# Patient Record
Sex: Male | Born: 1990 | Race: Black or African American | Hispanic: No | Marital: Single | Smoking: Current every day smoker
Health system: Southern US, Community
[De-identification: ages and names within clinical notes are randomized; demographics above are authoritative.]

## PROBLEM LIST (undated history)

## (undated) HISTORY — PX: TESTICLE REMOVAL: SHX68

## (undated) HISTORY — PX: LEG SURGERY: SHX1003

---

## 2010-06-17 ENCOUNTER — Emergency Department: Payer: Self-pay | Admitting: Emergency Medicine

## 2010-07-22 ENCOUNTER — Emergency Department: Payer: Self-pay | Admitting: Emergency Medicine

## 2010-07-25 ENCOUNTER — Emergency Department (HOSPITAL_COMMUNITY)
Admission: EM | Admit: 2010-07-25 | Discharge: 2010-07-25 | Disposition: A | Discharge status: Home / Self Care | Payer: Self-pay | Attending: Emergency Medicine | Admitting: Emergency Medicine

## 2010-07-25 ENCOUNTER — Emergency Department (HOSPITAL_COMMUNITY): Payer: Self-pay

## 2010-07-25 DIAGNOSIS — R599 Enlarged lymph nodes, unspecified: Secondary | ICD-10-CM | POA: Insufficient documentation

## 2010-07-25 DIAGNOSIS — R1310 Dysphagia, unspecified: Secondary | ICD-10-CM | POA: Insufficient documentation

## 2010-07-25 DIAGNOSIS — J039 Acute tonsillitis, unspecified: Secondary | ICD-10-CM | POA: Insufficient documentation

## 2010-07-25 MED ORDER — IOHEXOL 300 MG/ML  SOLN: 75.0000 mL | Freq: Once | INTRAMUSCULAR | Status: DC | PRN

## 2011-06-10 ENCOUNTER — Emergency Department: Payer: Self-pay | Admitting: Emergency Medicine

## 2011-06-13 ENCOUNTER — Emergency Department: Payer: Self-pay | Admitting: Emergency Medicine

## 2011-06-15 ENCOUNTER — Emergency Department: Payer: Self-pay | Admitting: *Deleted

## 2012-06-06 ENCOUNTER — Emergency Department: Payer: Self-pay

## 2015-06-06 ENCOUNTER — Encounter: Payer: Self-pay | Admitting: Emergency Medicine

## 2015-06-06 ENCOUNTER — Emergency Department
Admission: EM | Admit: 2015-06-06 | Discharge: 2015-06-06 | Disposition: A | Discharge status: Home / Self Care | Payer: Self-pay | Attending: Emergency Medicine | Admitting: Emergency Medicine

## 2015-06-06 DIAGNOSIS — R300 Dysuria: Secondary | ICD-10-CM

## 2015-06-06 DIAGNOSIS — F17200 Nicotine dependence, unspecified, uncomplicated: Secondary | ICD-10-CM | POA: Insufficient documentation

## 2015-06-06 DIAGNOSIS — Z202 Contact with and (suspected) exposure to infections with a predominantly sexual mode of transmission: Secondary | ICD-10-CM

## 2015-06-06 DIAGNOSIS — R360 Urethral discharge without blood: Secondary | ICD-10-CM | POA: Insufficient documentation

## 2015-06-06 LAB — CHLAMYDIA/NGC RT PCR (ARMC ONLY)
Chlamydia Tr: NOT DETECTED
N gonorrhoeae: DETECTED — AB

## 2015-06-06 MED ORDER — AZITHROMYCIN 250 MG PO TABS
1000.0000 mg | ORAL_TABLET | Freq: Once | ORAL | Status: AC
  Administered 2015-06-06: 1000 mg via ORAL
  Filled 2015-06-06: qty 4

## 2015-06-06 MED ORDER — LIDOCAINE HCL (PF) 1 % IJ SOLN
INTRAMUSCULAR | Status: AC
  Administered 2015-06-06: 0.9 mL
  Filled 2015-06-06: qty 5

## 2015-06-06 MED ORDER — METRONIDAZOLE 500 MG PO TABS: 2000.0000 mg | ORAL_TABLET | Freq: Once | ORAL | Status: DC

## 2015-06-06 MED ORDER — CEFTRIAXONE SODIUM 250 MG IJ SOLR
250.0000 mg | Freq: Once | INTRAMUSCULAR | Status: AC
  Administered 2015-06-06: 250 mg via INTRAMUSCULAR
  Filled 2015-06-06: qty 250

## 2015-06-06 NOTE — ED Notes (Signed)
Pt c/o dysuria and white penile discharge for 2 days.

## 2015-06-06 NOTE — Discharge Instructions (Signed)
Sexually Transmitted Disease °A sexually transmitted disease (STD) is a disease or infection that may be passed (transmitted) from person to person, usually during sexual activity. This may happen by way of saliva, semen, blood, vaginal mucus, or urine. Common STDs include: °· Gonorrhea. °· Chlamydia. °· Syphilis. °· HIV and AIDS. °· Genital herpes. °· Hepatitis B and C. °· Trichomonas. °· Human papillomavirus (HPV). °· Pubic lice. °· Scabies. °· Mites. °· Bacterial vaginosis. °WHAT ARE CAUSES OF STDs? °An STD may be caused by bacteria, a virus, or parasites. STDs are often transmitted during sexual activity if one person is infected. However, they may also be transmitted through nonsexual means. STDs may be transmitted after:  °· Sexual intercourse with an infected person. °· Sharing sex toys with an infected person. °· Sharing needles with an infected person or using unclean piercing or tattoo needles. °· Having intimate contact with the genitals, mouth, or rectal areas of an infected person. °· Exposure to infected fluids during birth. °WHAT ARE THE SIGNS AND SYMPTOMS OF STDs? °Different STDs have different symptoms. Some people may not have any symptoms. If symptoms are present, they may include: °· Painful or bloody urination. °· Pain in the pelvis, abdomen, vagina, anus, throat, or eyes. °· A skin rash, itching, or irritation. °· Growths, ulcerations, blisters, or sores in the genital and anal areas. °· Abnormal vaginal discharge with or without bad odor. °· Penile discharge in men. °· Fever. °· Pain or bleeding during sexual intercourse. °· Swollen glands in the groin area. °· Yellow skin and eyes (jaundice). This is seen with hepatitis. °· Swollen testicles. °· Infertility. °· Sores and blisters in the mouth. °HOW ARE STDs DIAGNOSED? °To make a diagnosis, your health care provider may: °· Take a medical history. °· Perform a physical exam. °· Take a sample of any discharge to examine. °· Swab the throat,  cervix, opening to the penis, rectum, or vagina for testing. °· Test a sample of your first morning urine. °· Perform blood tests. °· Perform a Pap test, if this applies. °· Perform a colposcopy. °· Perform a laparoscopy. °HOW ARE STDs TREATED? °Treatment depends on the STD. Some STDs may be treated but not cured. °· Chlamydia, gonorrhea, trichomonas, and syphilis can be cured with antibiotic medicine. °· Genital herpes, hepatitis, and HIV can be treated, but not cured, with prescribed medicines. The medicines lessen symptoms. °· Genital warts from HPV can be treated with medicine or by freezing, burning (electrocautery), or surgery. Warts may come back. °· HPV cannot be cured with medicine or surgery. However, abnormal areas may be removed from the cervix, vagina, or vulva. °· If your diagnosis is confirmed, your recent sexual partners need treatment. This is true even if they are symptom-free or have a negative culture or evaluation. They should not have sex until their health care providers say it is okay. °· Your health care provider may test you for infection again 3 months after treatment. °HOW CAN I REDUCE MY RISK OF GETTING AN STD? °Take these steps to reduce your risk of getting an STD: °· Use latex condoms, dental dams, and water-soluble lubricants during sexual activity. Do not use petroleum jelly or oils. °· Avoid having multiple sex partners. °· Do not have sex with someone who has other sex partners °· Do not have sex with anyone you do not know or who is at high risk for an STD. °· Avoid risky sex practices that can break your skin. °· Do not have sex   if you have open sores on your mouth or skin.  Avoid drinking too much alcohol or taking illegal drugs. Alcohol and drugs can affect your judgment and put you in a vulnerable position.  Avoid engaging in oral and anal sex acts.  Get vaccinated for HPV and hepatitis. If you have not received these vaccines in the past, talk to your health care  provider about whether one or both might be right for you.  If you are at risk of being infected with HIV, it is recommended that you take a prescription medicine daily to prevent HIV infection. This is called pre-exposure prophylaxis (PrEP). You are considered at risk if:  You are a man who has sex with other men (MSM).  You are a heterosexual man or woman and are sexually active with more than one partner.  You take drugs by injection.  You are sexually active with a partner who has HIV.  Talk with your health care provider about whether you are at high risk of being infected with HIV. If you choose to begin PrEP, you should first be tested for HIV. You should then be tested every 3 months for as long as you are taking PrEP. WHAT SHOULD I DO IF I THINK I HAVE AN STD?  See your health care provider.  Tell your sexual partner(s). They should be tested and treated for any STDs.  Do not have sex until your health care provider says it is okay. WHEN SHOULD I GET IMMEDIATE MEDICAL CARE? Contact your health care provider right away if:   You have severe abdominal pain.  You are a man and notice swelling or pain in your testicles.  You are a woman and notice swelling or pain in your vagina.   This information is not intended to replace advice given to you by your health care provider. Make sure you discuss any questions you have with your health care provider.   Document Released: 08/06/2002 Document Revised: 06/06/2014 Document Reviewed: 12/04/2012 Elsevier Interactive Patient Education 2016 [employer]  You have been treated for gonorrhea and chlamydia here in the ED. You should take the prescription medicine as directed for treatment of trichomoniasis. You should avoid any sexual contact for at least 7 days, all medicines have been completed, and all symptoms have cleared. Any and all sexual partners must be treated before contact. [facility] for ongoing symptoms.

## 2015-06-06 NOTE — ED Provider Notes (Signed)
[facility] Emergency Department Provider Note ____________________________________________  Time seen: 1715  I have reviewed the triage vital signs and the nursing notes.  HISTORY  Chief Complaint  Dysuria  HPI Alejandro ManeDonald R Brooking Jr. is a 25 y.o. male reports to the ED for evaluation of a 2-day c/o penile discharge and dysuria. He denies fevers, chills, sweats, nausea, vomiting. He is also without any symptoms including hematuria, flank pain, or pelvic pain. He reports his discomfort as 6/10 in triage.  History reviewed. No pertinent past medical history.  There are no active problems to display for this patient.   Past Surgical History  Procedure Laterality Date  . Leg surgery      Current Outpatient Rx  Name  Route  Sig  Dispense  Refill  . metroNIDAZOLE (FLAGYL) 500 MG tablet   Oral   Take 4 tablets (2,000 mg total) by mouth once.   4 tablet   0    Allergies Review of patient's allergies indicates no known allergies.  History reviewed. No pertinent family history.  Social History Social History  Substance Use Topics  . Smoking status: Current Every Day Smoker  . Smokeless tobacco: None  . Alcohol Use: Yes   Review of Systems  Constitutional: Negative for fever. Eyes: Negative for visual changes. ENT: Negative for sore throat. Cardiovascular: Negative for chest pain. Respiratory: Negative for shortness of breath. Gastrointestinal: Negative for abdominal pain, vomiting and diarrhea. Genitourinary: Positive for dysuria and penile discharge as above Musculoskeletal: Negative for back pain. Skin: Negative for rash. Neurological: Negative for headaches, focal weakness or numbness. ____________________________________________  PHYSICAL EXAM:  VITAL SIGNS: ED Triage Vitals  Enc Vitals Group     BP 06/06/15 1632 138/84 mmHg     Pulse Rate 06/06/15 1632 98     Resp 06/06/15 1632 18     Temp 06/06/15 1632 97.8 F (36.6 C)     Temp  Source 06/06/15 1632 Oral     SpO2 06/06/15 1632 96 %     Weight 06/06/15 1632 240 lb (108.863 kg)     Height 06/06/15 1632 6\' 1"  (1.854 m)     Head Cir --      Peak Flow --      Pain Score 06/06/15 1630 6     Pain Loc --      Pain Edu? --      Excl. in GC? --    Constitutional: Alert and oriented. Well appearing and in no distress. Head: Normocephalic and atraumatic.      Eyes: Conjunctivae are normal. PERRL. Normal extraocular movements   Nose: No congestion/rhinorrhea.   Mouth/Throat: Mucous membranes are moist.   Neck: Supple. No thyromegaly. Hematological/Lymphatic/Immunological: No cervical lymphadenopathy. Cardiovascular: Normal rate, regular rhythm.  Respiratory: Normal respiratory effort. No wheezes/rales/rhonchi. Gastrointestinal: Soft and nontender. No distention. GU: uncircumcised male with thick, white discharge expressed from the glans penis. No inguinal nodes appreciated.  Musculoskeletal: Nontender with normal range of motion in all extremities.  Neurologic:  Normal gait without ataxia. Normal speech and language. No gross focal neurologic deficits are appreciated. Skin:  Skin is warm, dry and intact. No rash noted. Psychiatric: Mood and affect are normal. Patient exhibits appropriate insight and judgment. ____________________________________________   LABS (pertinent positives/negatives) Labs Reviewed  CHLAMYDIA/NGC RT PCR ([facility] ONLY)  ____________________________________________  PROCEDURES  Azithromycin 1000 mg PO Rocephin 250 mg IM ____________________________________________  INITIAL IMPRESSION / ASSESSMENT AND PLAN / ED COURSE  Patient with empiric treatment for gonorrhea and chlamydia  in the ED, and a prescription for metronidazole to dose for trichomoniasis provided. He is advised to avoid any sexual contact until all medications are completed, symptoms have resolved, and partners have also been treated. He will follow up with the  [facility] Department for ongoing symptoms.GC urine culture is pending at the time of discharge. ____________________________________________  FINAL CLINICAL IMPRESSION(S) / ED DIAGNOSES  Final diagnoses:  STD exposure  Dysuria      Lissa Hoard, PA-C 06/06/15 1818  Phineas Semen, MD 06/06/15 1924

## 2015-06-08 ENCOUNTER — Telehealth: Payer: Self-pay | Admitting: Emergency Medicine
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
## 2015-06-16 ENCOUNTER — Telehealth: Payer: Self-pay | Admitting: Emergency Medicine
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
## 2016-08-29 ENCOUNTER — Encounter: Payer: Self-pay | Admitting: Emergency Medicine

## 2016-08-29 ENCOUNTER — Emergency Department
Admission: EM | Admit: 2016-08-29 | Discharge: 2016-08-29 | Disposition: A | Discharge status: Home / Self Care | Payer: Self-pay | Attending: Student in an Organized Health Care Education/Training Program | Admitting: Student in an Organized Health Care Education/Training Program

## 2016-08-29 ENCOUNTER — Emergency Department: Payer: Self-pay

## 2016-08-29 DIAGNOSIS — F17200 Nicotine dependence, unspecified, uncomplicated: Secondary | ICD-10-CM | POA: Insufficient documentation

## 2016-08-29 DIAGNOSIS — L03115 Cellulitis of right lower limb: Secondary | ICD-10-CM | POA: Insufficient documentation

## 2016-08-29 LAB — COMPREHENSIVE METABOLIC PANEL
ALT: 34 U/L (ref 17–63)
ANION GAP: 5 (ref 5–15)
AST: 31 U/L (ref 15–41)
Albumin: 4.3 g/dL (ref 3.5–5.0)
Alkaline Phosphatase: 67 U/L (ref 38–126)
BUN: 13 mg/dL (ref 6–20)
CHLORIDE: 110 mmol/L (ref 101–111)
CO2: 28 mmol/L (ref 22–32)
Calcium: 9.3 mg/dL (ref 8.9–10.3)
Creatinine, Ser: 1.16 mg/dL (ref 0.61–1.24)
GFR calc non Af Amer: >60 mL/min (ref >60)
Glucose, Bld: 93 mg/dL (ref 65–99)
POTASSIUM: 4.1 mmol/L (ref 3.5–5.1)
SODIUM: 143 mmol/L (ref 135–145)
Total Bilirubin: 0.3 mg/dL (ref 0.3–1.2)
Total Protein: 7.9 g/dL (ref 6.5–8.1)

## 2016-08-29 LAB — CBC
HEMATOCRIT: 42.6 % (ref 40.0–52.0)
Hemoglobin: 14.3 g/dL (ref 13.0–18.0)
MCH: 28.2 pg (ref 26.0–34.0)
MCHC: 33.6 g/dL (ref 32.0–36.0)
MCV: 83.7 fL (ref 80.0–100.0)
Platelets: 241 10*3/uL (ref 150–440)
RBC: 5.09 MIL/uL (ref 4.40–5.90)
RDW: 13.9 % (ref 11.5–14.5)
WBC: 7.7 10*3/uL (ref 3.8–10.6)

## 2016-08-29 LAB — SEDIMENTATION RATE: SED RATE: 10 mm/h (ref 0–15)

## 2016-08-29 LAB — URIC ACID: Uric Acid, Serum: 7.2 mg/dL (ref 4.4–7.6)

## 2016-08-29 LAB — C-REACTIVE PROTEIN: CRP: 2.1 mg/dL — AB (ref <1.0)

## 2016-08-29 MED ORDER — CEPHALEXIN 500 MG PO CAPS: 500.0000 mg | ORAL_CAPSULE | Freq: Four times a day (QID) | ORAL | 0 refills | Status: AC

## 2016-08-29 NOTE — ED Triage Notes (Signed)
Pt reports right foot pain, unsure of injury.

## 2016-08-29 NOTE — ED Provider Notes (Signed)
[facility] Emergency Department Provider Note  ____________________________________________  Time seen: Approximately 10:48 AM  I have reviewed the triage vital signs and the nursing notes.   HISTORY  Chief Complaint Foot Pain    HPI Alejandro Mckee. is a 26 y.o. male presents to emergency departmentwith right foot pain for one week. Patient states that he's had for pain on and off for the last year but it has worsened over the last week. Patient says that top of foot is red and warm to touch. He is able to move his toes without difficulty. This has never happened before. No trauma. Patient states that he had surgery in that foot several years ago. No recent surgery. He has no history of gout. He denies fever, chills, shortness of breath, chest pain, nausea, vomiting, abdominal pain, calf pain.   History reviewed. No pertinent past medical history.  There are no active problems to display for this patient.   Past Surgical History:  Procedure Laterality Date  . LEG SURGERY      Prior to Admission medications   Medication Sig Start Date End Date Taking? Authorizing Provider  cephALEXin (KEFLEX) 500 MG capsule Take 1 capsule (500 mg total) by mouth 4 (four) times daily. 08/29/16 09/08/16  Enid Derry, PA-C    Allergies Patient has no known allergies.  No family history on file.  Social History Social History  Substance Use Topics  . Smoking status: Current Every Day Smoker  . Smokeless tobacco: Not on file  . Alcohol use Yes     Review of Systems  Constitutional: No fever/chills ENT: No upper respiratory complaints. Cardiovascular: No chest pain. Respiratory: No cough. No SOB. Gastrointestinal: No abdominal pain.  No nausea, no vomiting.  Musculoskeletal: Positive for right foot pain. Skin: Negative for rash, abrasions, lacerations, ecchymosis. Neurological: Negative for headaches, numbness or  tingling   ____________________________________________   PHYSICAL EXAM:  VITAL SIGNS: ED Triage Vitals  Enc Vitals Group     BP 08/29/16 1043 119/72     Pulse Rate 08/29/16 1043 82     Resp 08/29/16 1043 18     Temp 08/29/16 1043 98.7 F (37.1 C)     Temp Source 08/29/16 1043 Oral     SpO2 08/29/16 1043 99 %     Weight 08/29/16 1012 230 lb (104.3 kg)     Height 08/29/16 1012 6' (1.829 m)     Head Circumference --      Peak Flow --      Pain Score 08/29/16 1012 7     Pain Loc --      Pain Edu? --      Excl. in GC? --      Constitutional: Alert and oriented. Well appearing and in no acute distress. Eyes: Conjunctivae are normal. PERRL. EOMI. Head: Atraumatic. ENT:      Ears:      Nose: No congestion/rhinnorhea.      Mouth/Throat: Mucous membranes are moist.  Neck: No stridor.  Cardiovascular: Normal rate, regular rhythm.  Good peripheral circulation. Respiratory: Normal respiratory effort without tachypnea or retractions. Lungs CTAB. Good air entry to the bases with no decreased or absent breath sounds. Musculoskeletal: Full range of motion to all extremities. No gross deformities appreciated.  Neurologic:  Normal speech and language. No gross focal neurologic deficits are appreciated.  Skin:  Skin is warm, dry. Top of foot erythematous and warm to touch.    ____________________________________________   LABS (all labs ordered  are listed, but only abnormal results are displayed)  Labs Reviewed  CBC  COMPREHENSIVE METABOLIC PANEL  URIC ACID  SEDIMENTATION RATE  C-REACTIVE PROTEIN   ____________________________________________  EKG   ____________________________________________  RADIOLOGY Lexine Baton, personally viewed and evaluated these images (plain radiographs) as part of my medical decision making, as well as reviewing the written report by the radiologist.  Dg Foot Complete Right  Result Date: 08/29/2016 CLINICAL DATA:  Pain in the right foot  for 1 week, no history of injury EXAM: RIGHT FOOT COMPLETE - 3+ VIEW COMPARISON:  None. FINDINGS: Tarsal-metatarsal alignment is normal. No fracture is seen. No erosion is evident. Joint spaces appear normal. IMPRESSION: Negative. Electronically Signed   By: Dwyane Dee M.D.   On: 08/29/2016 11:11    ____________________________________________    PROCEDURES  Procedure(s) performed:    Procedures    Medications - No data to display   ____________________________________________   INITIAL IMPRESSION / ASSESSMENT AND PLAN / ED COURSE  Pertinent labs & imaging results that were available during my care of the patient were reviewed by me and considered in my medical decision making (see chart for details).  Review of the [place] CSRS was performed in accordance of the [facility] prior to dispensing any controlled drugs.   Patient's diagnosis is consistent with cellulitis. Vital signs, exam, lab work are reassuring. Patient will be discharged home with prescriptions for Keflex. Patient is to follow up with PCP as directed. Patient is given ED precautions to return to the ED for any worsening or new symptoms.    ____________________________________________  FINAL CLINICAL IMPRESSION(S) / ED DIAGNOSES  Final diagnoses:  Cellulitis of right lower extremity      NEW MEDICATIONS STARTED DURING THIS VISIT:  Discharge Medication List as of 08/29/2016  1:19 PM    START taking these medications   Details  cephALEXin (KEFLEX) 500 MG capsule Take 1 capsule (500 mg total) by mouth 4 (four) times daily., Starting Mon 08/29/2016, Until Thu 09/08/2016, Print            This chart was dictated using voice recognition software/Dragon. Despite best efforts to proofread, errors can occur which can change the meaning. Any change was purely unintentional.    Enid Derry, PA-C 08/29/16 1544    Willy Eddy, MD 08/29/16 [phone]

## 2016-08-29 NOTE — ED Notes (Signed)
See triage note   Developed pain to right foot about 1 week ago w/o injury  No swelling noted  States pain is to entire foot  Has not tired anything for pain PTA

## 2016-10-19 ENCOUNTER — Ambulatory Visit: Payer: Self-pay | Admitting: Internal Medicine

## 2017-03-20 ENCOUNTER — Encounter (INDEPENDENT_AMBULATORY_CARE_PROVIDER_SITE_OTHER): Payer: Self-pay | Admitting: Podiatry

## 2017-03-20 NOTE — Progress Notes (Signed)
This encounter was created in error - please disregard.

## 2017-06-10 ENCOUNTER — Emergency Department
Admission: EM | Admit: 2017-06-10 | Discharge: 2017-06-10 | Disposition: A | Discharge status: Home / Self Care | Payer: No Typology Code available for payment source | Attending: Emergency Medicine | Admitting: Emergency Medicine

## 2017-06-10 ENCOUNTER — Encounter: Payer: Self-pay | Admitting: Emergency Medicine

## 2017-06-10 ENCOUNTER — Emergency Department: Payer: No Typology Code available for payment source

## 2017-06-10 DIAGNOSIS — S161XXA Strain of muscle, fascia and tendon at neck level, initial encounter: Secondary | ICD-10-CM | POA: Diagnosis not present

## 2017-06-10 DIAGNOSIS — M7918 Myalgia, other site: Secondary | ICD-10-CM

## 2017-06-10 DIAGNOSIS — F17200 Nicotine dependence, unspecified, uncomplicated: Secondary | ICD-10-CM | POA: Insufficient documentation

## 2017-06-10 DIAGNOSIS — Y9389 Activity, other specified: Secondary | ICD-10-CM | POA: Insufficient documentation

## 2017-06-10 DIAGNOSIS — Y999 Unspecified external cause status: Secondary | ICD-10-CM | POA: Diagnosis not present

## 2017-06-10 DIAGNOSIS — S199XXA Unspecified injury of neck, initial encounter: Secondary | ICD-10-CM | POA: Diagnosis present

## 2017-06-10 DIAGNOSIS — Y92410 Unspecified street and highway as the place of occurrence of the external cause: Secondary | ICD-10-CM | POA: Insufficient documentation

## 2017-06-10 MED ORDER — CYCLOBENZAPRINE HCL 10 MG PO TABS
10.0000 mg | ORAL_TABLET | Freq: Once | ORAL | Status: AC
  Administered 2017-06-10: 10 mg via ORAL
  Filled 2017-06-10: qty 1

## 2017-06-10 MED ORDER — TRAMADOL HCL 50 MG PO TABS
50.0000 mg | ORAL_TABLET | Freq: Once | ORAL | Status: AC
  Administered 2017-06-10: 50 mg via ORAL
  Filled 2017-06-10: qty 1

## 2017-06-10 MED ORDER — IBUPROFEN 800 MG PO TABS
800.0000 mg | ORAL_TABLET | Freq: Once | ORAL | Status: AC
  Administered 2017-06-10: 800 mg via ORAL
  Filled 2017-06-10: qty 1

## 2017-06-10 MED ORDER — CYCLOBENZAPRINE HCL 10 MG PO TABS: 10.0000 mg | ORAL_TABLET | Freq: Three times a day (TID) | ORAL | 0 refills | Status: DC | PRN

## 2017-06-10 MED ORDER — IBUPROFEN 800 MG PO TABS: 800.0000 mg | ORAL_TABLET | Freq: Three times a day (TID) | ORAL | 0 refills | Status: DC | PRN

## 2017-06-10 MED ORDER — TRAMADOL HCL 50 MG PO TABS: 50.0000 mg | ORAL_TABLET | Freq: Four times a day (QID) | ORAL | 0 refills | Status: AC | PRN

## 2017-06-10 NOTE — ED Notes (Signed)
Pt given pain medication, asking about XR for hand and leg. Informed Ron PA-C who went into the patient's room to discuss.
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
## 2017-06-10 NOTE — ED Notes (Signed)
PT in XR 
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
## 2017-06-12 ENCOUNTER — Encounter: Payer: Self-pay | Admitting: Emergency Medicine

## 2017-06-12 ENCOUNTER — Emergency Department: Payer: No Typology Code available for payment source

## 2017-06-12 ENCOUNTER — Emergency Department
Admission: EM | Admit: 2017-06-12 | Discharge: 2017-06-12 | Disposition: A | Discharge status: Home / Self Care | Payer: No Typology Code available for payment source | Attending: Emergency Medicine | Admitting: Emergency Medicine

## 2017-06-12 ENCOUNTER — Other Ambulatory Visit: Payer: Self-pay

## 2017-06-12 DIAGNOSIS — F17200 Nicotine dependence, unspecified, uncomplicated: Secondary | ICD-10-CM | POA: Diagnosis not present

## 2017-06-12 DIAGNOSIS — Y929 Unspecified place or not applicable: Secondary | ICD-10-CM | POA: Insufficient documentation

## 2017-06-12 DIAGNOSIS — M549 Dorsalgia, unspecified: Secondary | ICD-10-CM | POA: Diagnosis not present

## 2017-06-12 DIAGNOSIS — T148XXA Other injury of unspecified body region, initial encounter: Secondary | ICD-10-CM | POA: Diagnosis not present

## 2017-06-12 DIAGNOSIS — M25511 Pain in right shoulder: Secondary | ICD-10-CM | POA: Diagnosis present

## 2017-06-12 DIAGNOSIS — Y939 Activity, unspecified: Secondary | ICD-10-CM | POA: Diagnosis not present

## 2017-06-12 DIAGNOSIS — Y999 Unspecified external cause status: Secondary | ICD-10-CM | POA: Diagnosis not present
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
## 2017-06-12 NOTE — ED Notes (Signed)
Pt reports same pain as last week, states neck and right hip pain and shoulder pain. Pain is 7/10

## 2017-06-12 NOTE — Discharge Instructions (Signed)
Continue taking your medication as directed that was written for you on 06/10/17. Moist heat to muscles frequently or get in a warm shower for your muscles.  Continue to move frequently.  The average for muscle soreness is 4-5 days.  Also establish a primary care provider.  This could be done by calling either [facility], [facility], or [facility].
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
## 2017-06-23 ENCOUNTER — Emergency Department
Admission: EM | Admit: 2017-06-23 | Discharge: 2017-06-23 | Disposition: A | Discharge status: Home / Self Care | Payer: No Typology Code available for payment source | Attending: Emergency Medicine | Admitting: Emergency Medicine

## 2017-06-23 ENCOUNTER — Emergency Department: Payer: No Typology Code available for payment source

## 2017-06-23 ENCOUNTER — Other Ambulatory Visit: Payer: Self-pay

## 2017-06-23 ENCOUNTER — Encounter: Payer: Self-pay | Admitting: Emergency Medicine

## 2017-06-23 DIAGNOSIS — M549 Dorsalgia, unspecified: Secondary | ICD-10-CM | POA: Diagnosis not present

## 2017-06-23 DIAGNOSIS — M7918 Myalgia, other site: Secondary | ICD-10-CM | POA: Diagnosis not present

## 2017-06-23 DIAGNOSIS — F17200 Nicotine dependence, unspecified, uncomplicated: Secondary | ICD-10-CM | POA: Insufficient documentation

## 2017-06-23 DIAGNOSIS — M25511 Pain in right shoulder: Secondary | ICD-10-CM | POA: Diagnosis present

## 2017-06-23 MED ORDER — IBUPROFEN 800 MG PO TABS: 800.0000 mg | ORAL_TABLET | Freq: Three times a day (TID) | ORAL | 0 refills | Status: DC | PRN

## 2017-06-23 MED ORDER — CYCLOBENZAPRINE HCL 5 MG PO TABS: ORAL_TABLET | ORAL | 0 refills | Status: DC
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
## 2017-08-16 LAB — HM HIV SCREENING LAB: HM HIV Screening: NEGATIVE

## 2018-04-20 ENCOUNTER — Encounter: Payer: Self-pay | Admitting: Podiatry

## 2018-04-20 ENCOUNTER — Ambulatory Visit: Payer: Self-pay

## 2018-04-20 DIAGNOSIS — M722 Plantar fascial fibromatosis: Secondary | ICD-10-CM

## 2018-04-22 NOTE — Progress Notes (Signed)
This encounter was created in error - please disregard.

## 2018-08-17 ENCOUNTER — Emergency Department
Admission: EM | Admit: 2018-08-17 | Discharge: 2018-08-17 | Disposition: A | Discharge status: Home / Self Care | Payer: Self-pay | Attending: Emergency Medicine | Admitting: Emergency Medicine

## 2018-08-17 ENCOUNTER — Encounter: Payer: Self-pay | Admitting: Emergency Medicine

## 2018-08-17 ENCOUNTER — Other Ambulatory Visit: Payer: Self-pay

## 2018-08-17 ENCOUNTER — Emergency Department: Payer: Self-pay

## 2018-08-17 DIAGNOSIS — R519 Headache, unspecified: Secondary | ICD-10-CM

## 2018-08-17 DIAGNOSIS — F17210 Nicotine dependence, cigarettes, uncomplicated: Secondary | ICD-10-CM | POA: Insufficient documentation

## 2018-08-17 DIAGNOSIS — R51 Headache: Secondary | ICD-10-CM | POA: Insufficient documentation

## 2018-08-17 DIAGNOSIS — J0110 Acute frontal sinusitis, unspecified: Secondary | ICD-10-CM | POA: Insufficient documentation

## 2018-08-17 LAB — CBC WITH DIFFERENTIAL/PLATELET
Abs Immature Granulocytes: 0.03 10*3/uL (ref 0.00–0.07)
Basophils Absolute: 0.1 10*3/uL (ref 0.0–0.1)
Basophils Relative: 1 %
EOS ABS: 0.1 10*3/uL (ref 0.0–0.5)
EOS PCT: 1 %
HEMATOCRIT: 41.2 % (ref 39.0–52.0)
Hemoglobin: 14.5 g/dL (ref 13.0–17.0)
Immature Granulocytes: 0 %
LYMPHS ABS: 2.7 10*3/uL (ref 0.7–4.0)
Lymphocytes Relative: 22 %
MCH: 28 pg (ref 26.0–34.0)
MCHC: 35.2 g/dL (ref 30.0–36.0)
MCV: 79.7 fL — AB (ref 80.0–100.0)
MONO ABS: 0.8 10*3/uL (ref 0.1–1.0)
MONOS PCT: 6 %
Neutro Abs: 8.7 10*3/uL — ABNORMAL HIGH (ref 1.7–7.7)
Neutrophils Relative %: 70 %
Platelets: 268 10*3/uL (ref 150–400)
RBC: 5.17 MIL/uL (ref 4.22–5.81)
RDW: 13.9 % (ref 11.5–15.5)
WBC: 12.4 10*3/uL — ABNORMAL HIGH (ref 4.0–10.5)
nRBC: 0 % (ref 0.0–0.2)

## 2018-08-17 LAB — COMPREHENSIVE METABOLIC PANEL
ALBUMIN: 4.2 g/dL (ref 3.5–5.0)
ALK PHOS: 67 U/L (ref 38–126)
ALT: 34 U/L (ref 0–44)
AST: 28 U/L (ref 15–41)
Anion gap: 8 (ref 5–15)
BUN: 11 mg/dL (ref 6–20)
CALCIUM: 9.4 mg/dL (ref 8.9–10.3)
CO2: 24 mmol/L (ref 22–32)
CREATININE: 1.28 mg/dL — AB (ref 0.61–1.24)
Chloride: 107 mmol/L (ref 98–111)
GFR calc non Af Amer: >60 mL/min (ref >60)
GLUCOSE: 109 mg/dL — AB (ref 70–99)
Potassium: 3.5 mmol/L (ref 3.5–5.1)
SODIUM: 139 mmol/L (ref 135–145)
Total Bilirubin: 0.6 mg/dL (ref 0.3–1.2)
Total Protein: 7.8 g/dL (ref 6.5–8.1)

## 2018-08-17 LAB — URINALYSIS, COMPLETE (UACMP) WITH MICROSCOPIC
BACTERIA UA: NONE SEEN
Bilirubin Urine: NEGATIVE
Glucose, UA: NEGATIVE mg/dL
HGB URINE DIPSTICK: NEGATIVE
Ketones, ur: NEGATIVE mg/dL
Leukocytes,Ua: NEGATIVE
Nitrite: NEGATIVE
Protein, ur: NEGATIVE mg/dL
SPECIFIC GRAVITY, URINE: 1.013 (ref 1.005–1.030)
Squamous Epithelial / LPF: NONE SEEN (ref 0–5)
pH: 8 (ref 5.0–8.0)

## 2018-08-17 MED ORDER — KETOROLAC TROMETHAMINE 30 MG/ML IJ SOLN
30.0000 mg | Freq: Once | INTRAMUSCULAR | Status: AC
  Administered 2018-08-17: 30 mg via INTRAVENOUS
  Filled 2018-08-17: qty 1

## 2018-08-17 MED ORDER — AMOXICILLIN-POT CLAVULANATE 875-125 MG PO TABS: 1.0000 | ORAL_TABLET | Freq: Two times a day (BID) | ORAL | 0 refills | Status: AC

## 2018-08-17 NOTE — ED Notes (Signed)
Patient transported to CT 

## 2018-08-17 NOTE — ED Notes (Addendum)
Pt reports body aches with sweating and chills since yesterday. Afebrile today. Pt took tylenol at 5:45 for his headache but nothing has helped.

## 2018-08-17 NOTE — ED Provider Notes (Signed)
[facility] Emergency Department Provider Note   ____________________________________________   First MD Initiated Contact with Patient 08/17/18 [phone]     (approximate)  I have reviewed the triage vital signs and the nursing notes.   HISTORY  Chief Complaint Headache and Fever   HPI Alejandro Alvira. is a 28 y.o. male patient complains of fever frontal headache and right-sided back pain since yesterday.  He is not having a cough.  The frontal headache is bad but not severe worse if he bends his head forward.  Back is achy.  No medicines or activities.         History reviewed. No pertinent past medical history.  There are no active problems to display for this patient.   Past Surgical History:  Procedure Laterality Date  . LEG SURGERY      Prior to Admission medications   Medication Sig Start Date End Date Taking? Authorizing Provider  cyclobenzaprine (FLEXERIL) 5 MG tablet Take 1-2 tablets 3 times daily as needed 06/23/17   Alejandro Derry, PA-C  ibuprofen (ADVIL,MOTRIN) 800 MG tablet Take 1 tablet (800 mg total) by mouth every 8 (eight) hours as needed. 06/23/17   Alejandro Derry, PA-C    Allergies Patient has no known allergies.  History reviewed. No pertinent family history.  Social History Social History   Tobacco Use  . Smoking status: Current Every Day Smoker  . Smokeless tobacco: Never Used  Substance Use Topics  . Alcohol use: Yes  . Drug use: No    Review of Systems  Constitutional:  fever/chills Eyes: No visual changes. ENT: No sore throat. Cardiovascular: Denies chest pain. Respiratory: Denies shortness of breath. Gastrointestinal: No abdominal pain.  No nausea, no vomiting.  No diarrhea.  No constipation. Genitourinary: Negative for dysuria. Musculoskeletal:  back pain. Skin: Negative for rash. Neurological: Negative for focal weakness   ____________________________________________   PHYSICAL EXAM:  VITAL  SIGNS: ED Triage Vitals [08/17/18 0613]  Enc Vitals Group     BP 91/64     Pulse Rate 67     Resp 20     Temp 98.4 F (36.9 C)     Temp Source Oral     SpO2 98 %     Weight      Height      Head Circumference      Peak Flow      Pain Score      Pain Loc      Pain Edu?      Excl. in GC?     Constitutional: Alert and oriented. Well appearing and in no acute distress. Eyes: Conjunctivae are normal. PERRL. EOMI. Head: Atraumatic.  Tender to percussion over frontal sinuses not of the maxillary sinuses Nose: No congestion/rhinnorhea. Mouth/Throat: Mucous membranes are moist.  Oropharynx non-erythematous. Neck: No stridor.   Cardiovascular: Normal rate, regular rhythm. Grossly normal heart sounds.  Good peripheral circulation. Respiratory: Normal respiratory effort.  No retractions. Lungs CTAB. Gastrointestinal: Soft and nontender. No distention. No abdominal bruits. No CVA tenderness. Musculoskeletal: No lower extremity tenderness nor edema.  Neurologic:  Normal speech and language. No gross focal neurologic deficits are appreciated Skin:  Skin is warm, dry and intact. No rash noted. Psychiatric: Mood and affect are normal. Speech and behavior are normal.  ____________________________________________   LABS (all labs ordered are listed, but only abnormal results are displayed)  Labs Reviewed  URINALYSIS, COMPLETE (UACMP) WITH MICROSCOPIC - Abnormal; Notable for the following components:  Result Value   Color, Urine YELLOW (*)    APPearance CLEAR (*)    All other components within normal limits  COMPREHENSIVE METABOLIC PANEL - Abnormal; Notable for the following components:   Glucose, Bld 109 (*)    Creatinine, Ser 1.28 (*)    All other components within normal limits  CBC WITH DIFFERENTIAL/PLATELET - Abnormal; Notable for the following components:   WBC 12.4 (*)    MCV 79.7 (*)    Neutro Abs 8.7 (*)    All other components within normal limits    ____________________________________________  EKG   ____________________________________________  RADIOLOGY  ED MD interpretation: CT scan read by radiology is negative on my review I think there is a little bit of swelling in a few of the sinuses little bit of mucus thickening.  Official radiology report(s): Ct Head Wo Contrast  Result Date: 08/17/2018 CLINICAL DATA:  Cough and cold sweats which shortness-of-breath 2 days. EXAM: CT HEAD WITHOUT CONTRAST TECHNIQUE: Contiguous axial images were obtained from the base of the skull through the vertex without intravenous contrast. COMPARISON:  06/10/2011 FINDINGS: Brain: No evidence of acute infarction, hemorrhage, hydrocephalus, extra-axial collection or mass lesion/mass effect. Vascular: No hyperdense vessel or unexpected calcification. Skull: Normal. Negative for fracture or focal lesion. Sinuses/Orbits: No acute finding. Other: None. IMPRESSION: Normal head CT. Electronically Signed   By: Elberta Fortis M.D.   On: 08/17/2018 07:41    ____________________________________________   PROCEDURES  Procedure(s) performed (including Critical Care):  Procedures   ____________________________________________   INITIAL IMPRESSION / ASSESSMENT AND PLAN / ED COURSE  Patient has a elevated white count and symptoms consistent with sinusitis I will give him some Augmentin and recommend Motrin for the headache.  Her Toradol seem to have helped him.  Patient again denies any shortness of breath or cough.              ____________________________________________   FINAL CLINICAL IMPRESSION(S) / ED DIAGNOSES  Final diagnoses:  Nonintractable headache, unspecified chronicity pattern, unspecified headache type  Acute non-recurrent frontal sinusitis     ED Discharge Orders    None       Note:  This document was prepared using Dragon voice recognition software and may include unintentional dictation errors.    Alejandro Natal,  MD 08/17/18 [phone]

## 2018-08-17 NOTE — Discharge Instructions (Signed)
Try some over-the-counter decongestant for day or 2.  Use Motrin 3 of the over-the-counter pills 4 times a day with food or 4 pills 3 times a day with food for the next 3 or 4 days.  This should help with the headache.  He can also add Tylenol to it.  Take the Augmentin 1 pill twice a day also with food.  Sometimes Augmentin will give you diarrhea if you do not take it with food.  Please return here for worse headache, fever or feeling sicker or if you do get bad diarrhea.

## 2018-08-17 NOTE — ED Triage Notes (Signed)
Pt c/o cough, cold sweats and SOB x2 day. Pt denies travel in last 14 days. Pt denies chest pain.

## 2019-05-13 ENCOUNTER — Other Ambulatory Visit: Payer: Self-pay

## 2019-05-13 ENCOUNTER — Ambulatory Visit: Payer: Self-pay | Admitting: Physician Assistant

## 2019-05-13 ENCOUNTER — Encounter: Payer: Self-pay | Admitting: Physician Assistant

## 2019-05-13 DIAGNOSIS — Z113 Encounter for screening for infections with a predominantly sexual mode of transmission: Secondary | ICD-10-CM

## 2019-05-13 LAB — GRAM STAIN

## 2019-05-13 NOTE — Progress Notes (Signed)
   [facility] Department STI clinic/screening visit  Subjective:  Alejandro Stephanie. is a 28 y.o. male being seen today for an STI screening visit. The patient reports they do not have symptoms.    Patient has the following medical conditions:  There are no problems to display for this patient.    Chief Complaint  Patient presents with  . SEXUALLY TRANSMITTED DISEASE    HPI  Patient reports that he has a new partner and would like a screening.  Denies any symptoms, chronic conditions and regular medications today.   See flowsheet for further details and programmatic requirements.    The following portions of the patient's history were reviewed and updated as appropriate: allergies, current medications, past medical history, past social history, past surgical history and problem list.  Objective:  There were no vitals filed for this visit.  Physical Exam Constitutional:      General: He is not in acute distress.    Appearance: Normal appearance.  HENT:     Head: Normocephalic and atraumatic.     Comments: No nits, lice or hair loss. No cervical, supraclavicular and axillary adenopathy.    Mouth/Throat:     Mouth: Mucous membranes are moist.     Pharynx: Oropharynx is clear. No oropharyngeal exudate or posterior oropharyngeal erythema.  Eyes:     Conjunctiva/sclera: Conjunctivae normal.  Pulmonary:     Effort: Pulmonary effort is normal.  Abdominal:     Palpations: Abdomen is soft. There is no mass.     Tenderness: There is no abdominal tenderness. There is no guarding or rebound.  Genitourinary:    Penis: Normal.      Testes: Normal.     Comments: Pubic area without nits, lice, edema, erythema, lesions and inguinal adenopathy. Penis circumcised and without discharge at meatus. Left testicle surgically absent. Musculoskeletal:     Cervical back: Neck supple. No tenderness.  Skin:    General: Skin is warm and dry.     Findings: No bruising, erythema  or lesion.  Neurological:     Mental Status: He is alert and oriented to person, place, and time.  Psychiatric:        Mood and Affect: Mood normal.        Behavior: Behavior normal.        Thought Content: Thought content normal.        Judgment: Judgment normal.       Assessment and Plan:  Alejandro Whitmoyer. is a 28 y.o. male presenting to the [place] for STI screening  1. Screening for STD (sexually transmitted disease) Patient into clinic without symptoms. Rec condoms with all sex. Await test results.  Counseled that RN will call if needs to RTC for treatment once results are back. - Gram stain - Gonococcus culture - HIV [facility] - Syphilis Serology, [facility]     No follow-ups on file.  No future appointments.  Jerene Dilling, PA

## 2019-05-18 LAB — GONOCOCCUS CULTURE

## 2019-05-23 IMAGING — CT CT HEAD WITHOUT CONTRAST
3 series · 16 of 47 positions shown, 19 images · non-contrast
Comparison: 06/10/2011

CLINICAL DATA: Cough and cold sweats which shortness-of-breath 2
days.

EXAM:
CT HEAD WITHOUT CONTRAST
TECHNIQUE: Contiguous axial images were obtained from the base of the skull
through the vertex without intravenous contrast.

[Series 2: head wo · axial · 0.42mm/px · z∈[-110,+25]mm · 10 of 33 slices shown, 13 images]
[im 3/33  brain]
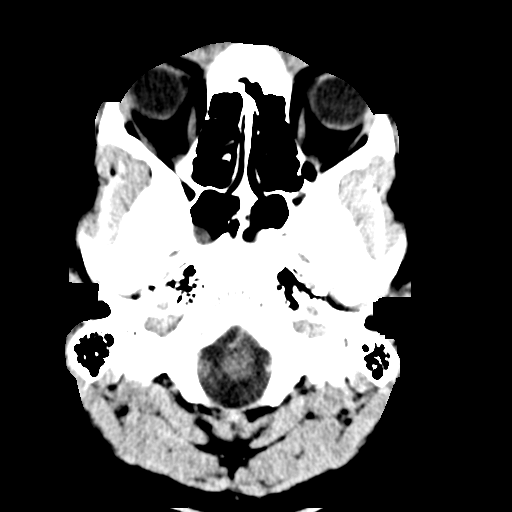
[im 3/33  bone]
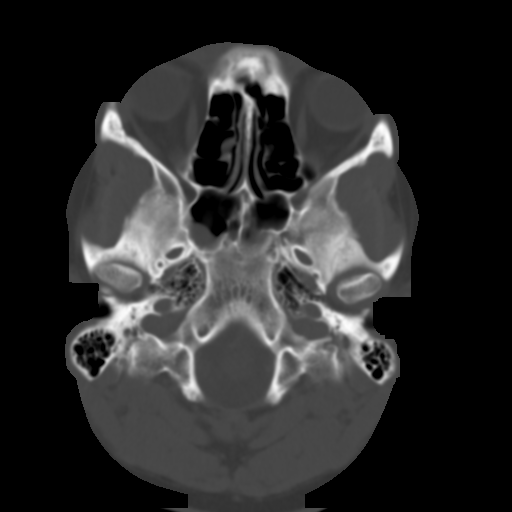
[im 6/33  brain]
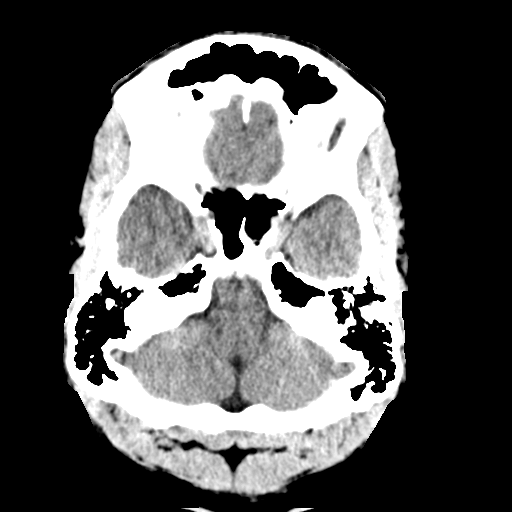
[im 9/33  brain]
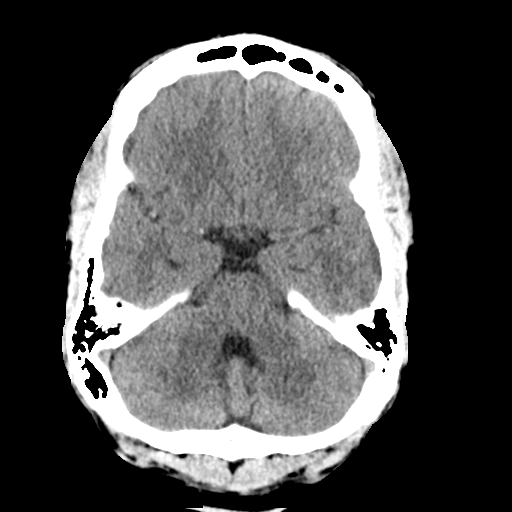
[im 12/33  brain]
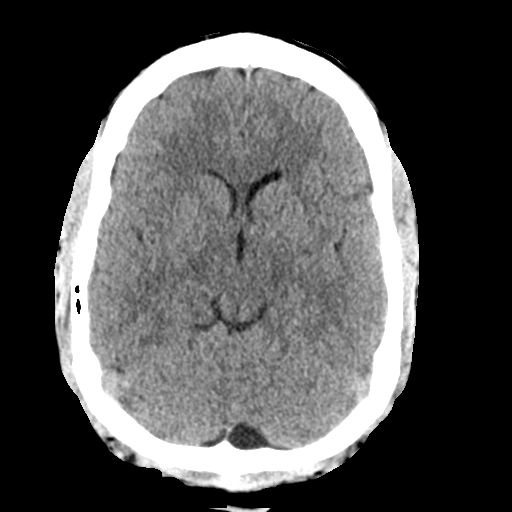
[im 15/33  brain]
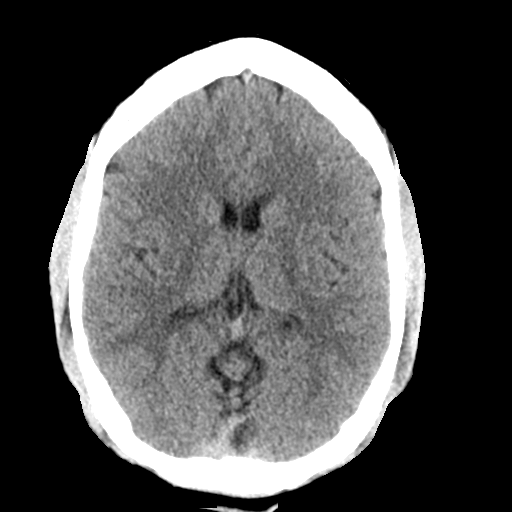
[im 15/33  bone]
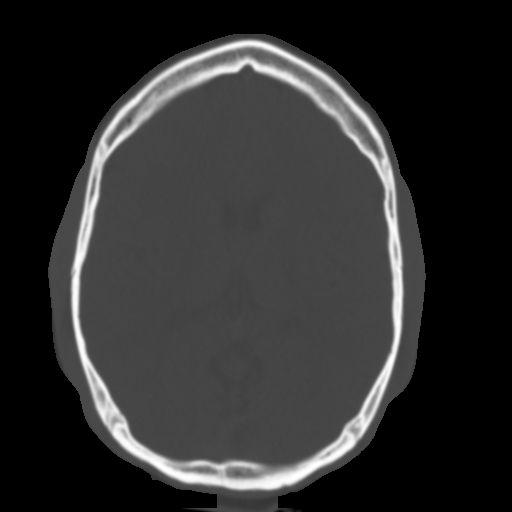
[im 18/33  brain]
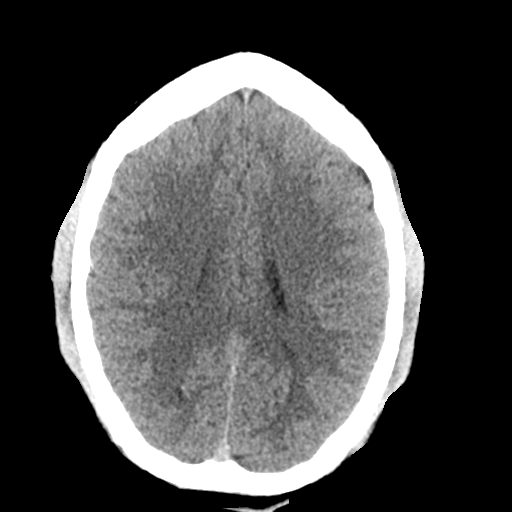
[im 21/33  brain]
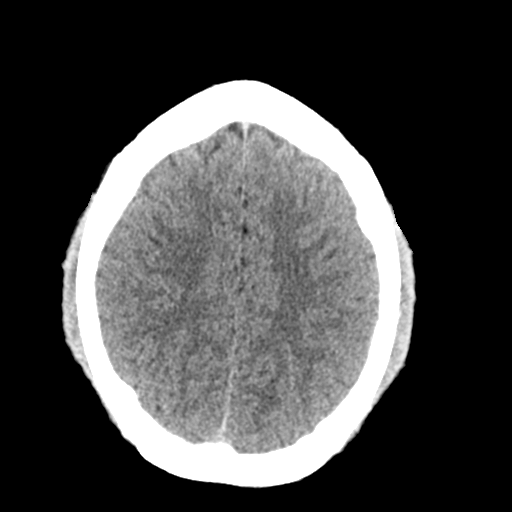
[im 25/33  brain]
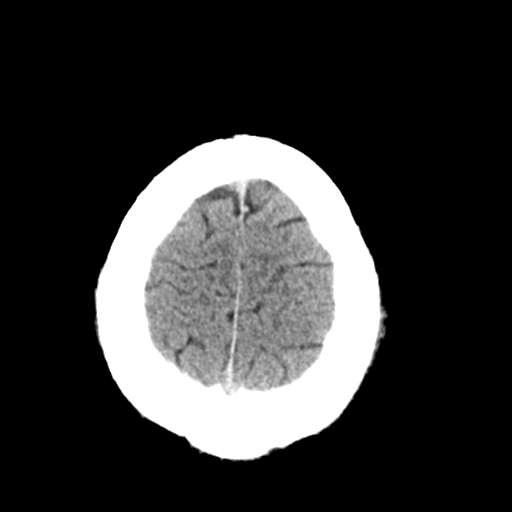
[im 27/33  brain]
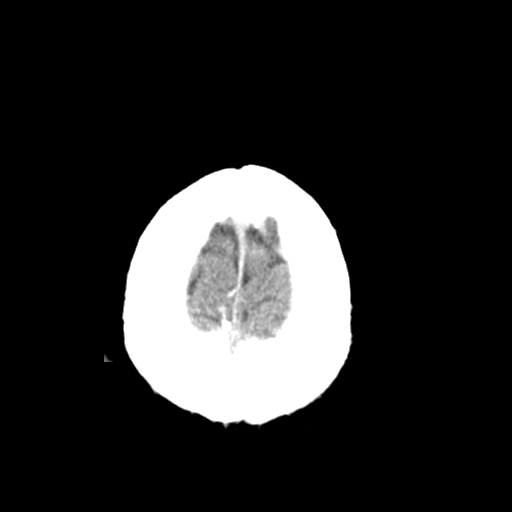
[im 27/33  bone]
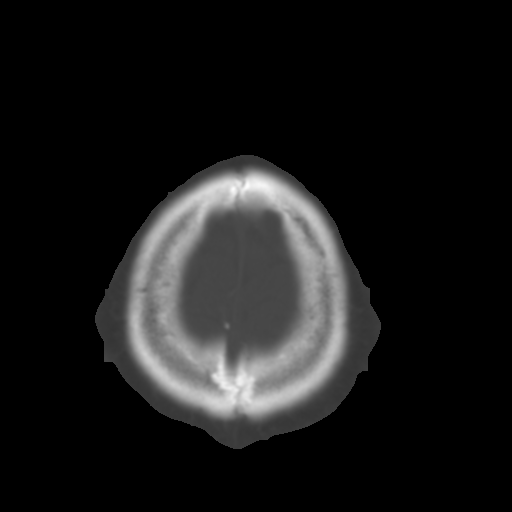
[im 30/33  brain]
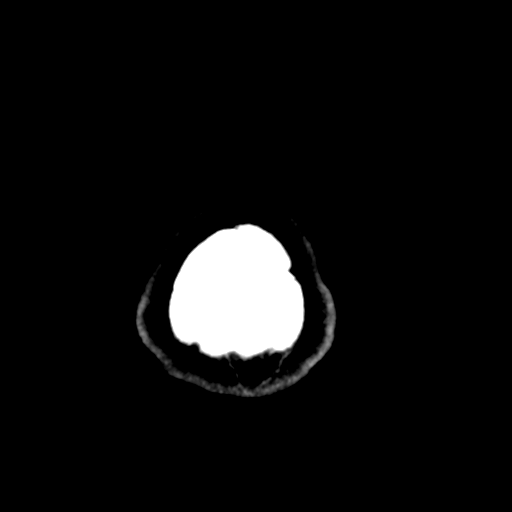

[Series 4: coronal soft tissue · coronal · 0.35mm/px · 3 of 72 slices shown]
[im 24/72  brain]
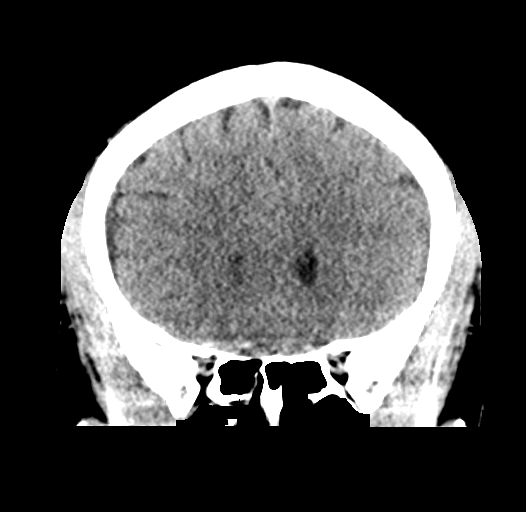
[im 32/72  brain]
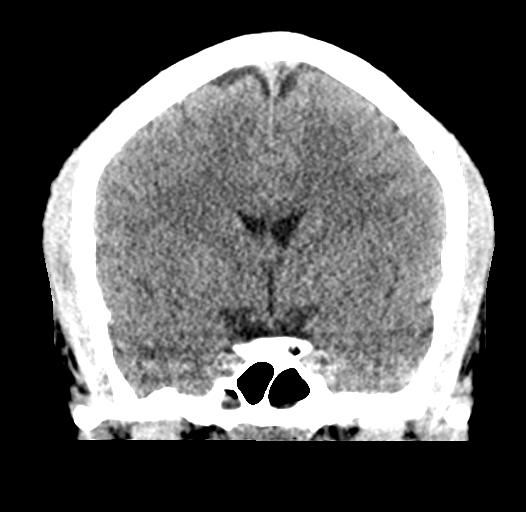
[im 40/72  brain]
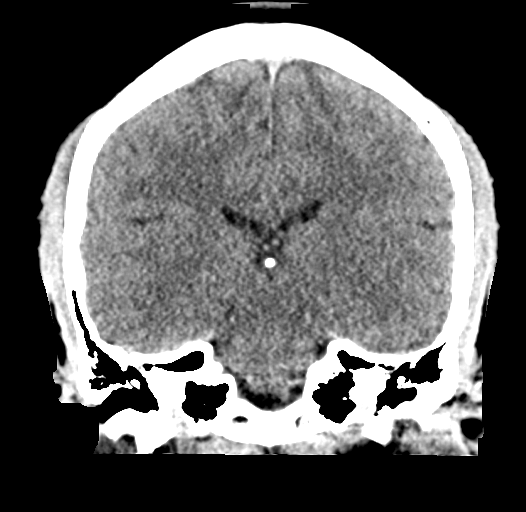

[Series 5: sagittal soft tissue · sagittal · 0.34mm/px · 3 of 61 slices shown]
[im 21/61  brain]
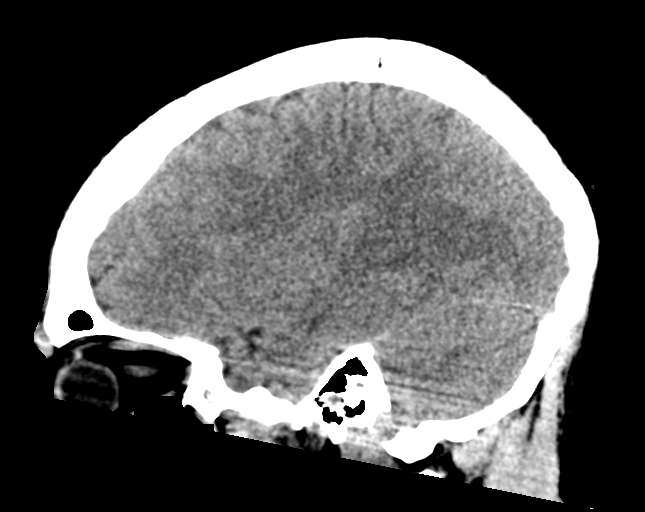
[im 31/61  brain]
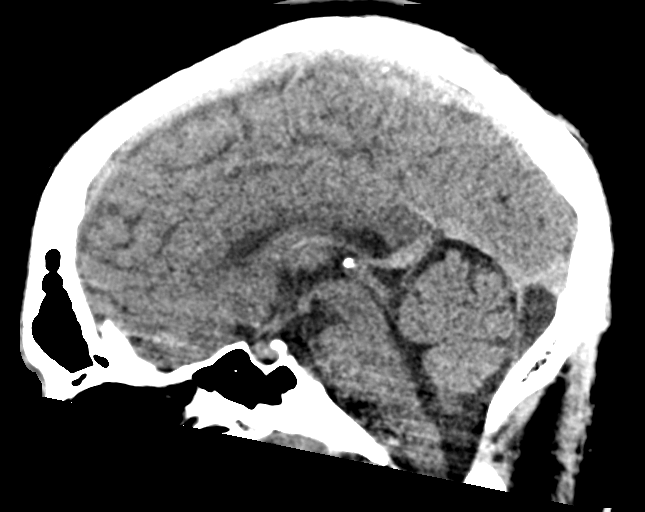
[im 41/61  brain]
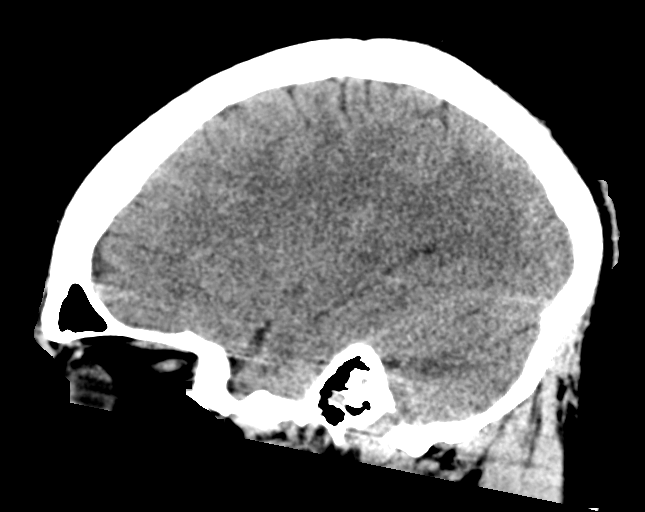

[16 of 47 positions shown; findings below may reference images not displayed]

FINDINGS: Brain: No evidence of acute infarction, hemorrhage, hydrocephalus,
extra-axial collection or mass lesion/mass effect.

Vascular: No hyperdense vessel or unexpected calcification.

Skull: Normal. Negative for fracture or focal lesion.

Sinuses/Orbits: No acute finding.

Other: None.
IMPRESSION: Normal head CT.

## 2020-03-09 ENCOUNTER — Emergency Department: Payer: Self-pay

## 2020-03-09 ENCOUNTER — Encounter: Payer: Self-pay | Admitting: Emergency Medicine

## 2020-03-09 ENCOUNTER — Other Ambulatory Visit: Payer: Self-pay

## 2020-03-09 ENCOUNTER — Emergency Department
Admission: EM | Admit: 2020-03-09 | Discharge: 2020-03-09 | Disposition: A | Discharge status: Home / Self Care | Payer: Self-pay | Attending: Emergency Medicine | Admitting: Emergency Medicine

## 2020-03-09 DIAGNOSIS — F17200 Nicotine dependence, unspecified, uncomplicated: Secondary | ICD-10-CM | POA: Insufficient documentation

## 2020-03-09 DIAGNOSIS — L03116 Cellulitis of left lower limb: Secondary | ICD-10-CM | POA: Insufficient documentation

## 2020-03-09 DIAGNOSIS — B353 Tinea pedis: Secondary | ICD-10-CM | POA: Insufficient documentation

## 2020-03-09 DIAGNOSIS — L03119 Cellulitis of unspecified part of limb: Secondary | ICD-10-CM

## 2020-03-09 LAB — GLUCOSE, CAPILLARY: Glucose-Capillary: 101 mg/dL — ABNORMAL HIGH (ref 70–99)

## 2020-03-09 MED ORDER — SULFAMETHOXAZOLE-TRIMETHOPRIM 800-160 MG PO TABS: 1.0000 | ORAL_TABLET | Freq: Two times a day (BID) | ORAL | 0 refills | Status: DC

## 2020-03-09 MED ORDER — TERBINAFINE HCL 1 % EX CREA: 1.0000 "application " | TOPICAL_CREAM | Freq: Two times a day (BID) | CUTANEOUS | 0 refills | Status: DC

## 2020-03-09 NOTE — ED Provider Notes (Signed)
[facility] Emergency Department Provider Note  ____________________________________________   First MD Initiated Contact with Patient 03/09/20 1305     (approximate)  I have reviewed the triage vital signs and the nursing notes.   HISTORY  Chief Complaint Foot Pain    HPI Alejandro Mckee. is a 29 y.o. male presents emergency department complaining of left foot pain that increased on Friday.  Patient states he has had open wounds on the area between his toes for maybe a month.  Unknown if he is diabetic.  States then the foot began to swell he started having more trouble with it.  No fever or chills.  Some broken skin on the area at the right toes also.  Patient has not treated his athlete's foot with any over-the-counter medications.    History reviewed. No pertinent past medical history.  There are no problems to display for this patient.   Past Surgical History:  Procedure Laterality Date   LEG SURGERY     TESTICLE REMOVAL Left     Prior to Admission medications   Medication Sig Start Date End Date Taking? Authorizing Provider  sulfamethoxazole-trimethoprim (BACTRIM DS) 800-160 MG tablet Take 1 tablet by mouth 2 (two) times daily. 03/09/20   [place] Janus, Roselyn Bering, PA-C  terbinafine (LAMISIL AT) 1 % cream Apply 1 application topically 2 (two) times daily. 03/09/20   Faythe Ghee, PA-C    Allergies Patient has no known allergies.  No family history on file.  Social History Social History   Tobacco Use   Smoking status: Current Every Day Smoker   Smokeless tobacco: Never Used  Substance Use Topics   Alcohol use: Yes   Drug use: No    Review of Systems  Constitutional: No fever/chills Eyes: No visual changes. ENT: No sore throat. Respiratory: Denies cough Genitourinary: Negative for dysuria. Musculoskeletal: Negative for back pain.  Positive for left foot pain Skin: Negative for rash. Psychiatric: no mood changes,      ____________________________________________   PHYSICAL EXAM:  VITAL SIGNS: ED Triage Vitals  Enc Vitals Group     BP 03/09/20 1209 (!) 147/91     Pulse Rate 03/09/20 1209 78     Resp 03/09/20 1209 16     Temp 03/09/20 1209 98.6 F (37 C)     Temp Source 03/09/20 1209 Oral     SpO2 03/09/20 1209 99 %     Weight 03/09/20 1154 250 lb (113.4 kg)     Height 03/09/20 1154 6\' 1"  (1.854 m)     Head Circumference --      Peak Flow --      Pain Score 03/09/20 1154 6     Pain Loc --      Pain Edu? --      Excl. in GC? --     Constitutional: Alert and oriented. Well appearing and in no acute distress. Eyes: Conjunctivae are normal.  Head: Atraumatic. Nose: No congestion/rhinnorhea. Mouth/Throat: Mucous membranes are moist.   Neck:  supple no lymphadenopathy noted Cardiovascular: Normal rate, regular rhythm.  Respiratory: Normal respiratory effort.  No retractions,  GU: deferred Musculoskeletal: FROM all extremities, warm and well perfused, left foot has discolored toes across the left foot with darkened skin and dry cracked areas noted between the toes, area between the third and fourth toe has some drainage and open wound.  The left foot is tender along the metatarsals distally, neurovascular is intact Neurologic:  Normal speech and language.  Skin:  Skin is warm, dry . No rash noted. Psychiatric: Mood and affect are normal. Speech and behavior are normal.  ____________________________________________   LABS (all labs ordered are listed, but only abnormal results are displayed)  Labs Reviewed  GLUCOSE, CAPILLARY - Abnormal; Notable for the following components:      Result Value   Glucose-Capillary 101 (*)    All other components within normal limits  CBG MONITORING, ED   ____________________________________________   ____________________________________________  RADIOLOGY  X-ray of the left foot is  negative  ____________________________________________   PROCEDURES  Procedure(s) performed: No  Procedures    ____________________________________________   INITIAL IMPRESSION / ASSESSMENT AND PLAN / ED COURSE  Pertinent labs & imaging results that were available during my care of the patient were reviewed by me and considered in my medical decision making (see chart for details).   Patient is 29 year old male presents emergency department with left foot pain.  See HPI.  Physical exam is consistent with athlete's foot with an underlying cellulitis.  Concerns for osteomyelitis of the x-ray of the foot.  Patient does not know if he is diabetic so fingerstick to evaluate his blood glucose at this time.   Blood glucose was 101 which is normal, x-ray of the left foot is negative for any osteomyelitis or fracture.  Did discuss findings with patient.  Concerns for infection so patient was treated with antibiotics and was also instructed to use a antifungal medication on the foot to prevent further cracks in the skin and additional infections.  Went to deep detail with him on how to prevent tinea.  States he understands.  He was discharged in stable condition.  Return if worsening.  Alejandro Mane. was evaluated in Emergency Department on 03/09/2020 for the symptoms described in the history of present illness. He was evaluated in the context of the global COVID-19 pandemic, which necessitated consideration that the patient might be at risk for infection with the SARS-CoV-2 virus that causes COVID-19. Institutional protocols and algorithms that pertain to the evaluation of patients at risk for COVID-19 are in a state of rapid change based on information released by regulatory bodies including the CDC and federal and state organizations. These policies and algorithms were followed during the patient's care in the ED.    As part of my medical decision making, I reviewed the following data  within the electronic MEDICAL RECORD NUMBER Nursing notes reviewed and incorporated, Labs reviewed , Old chart reviewed, Radiograph reviewed , Notes from prior ED visits and [place] Controlled Substance Database  ____________________________________________   FINAL CLINICAL IMPRESSION(S) / ED DIAGNOSES  Final diagnoses:  Athlete's foot on left  Cellulitis of foot      NEW MEDICATIONS STARTED DURING THIS VISIT:  Discharge Medication List as of 03/09/2020  2:35 PM    START taking these medications   Details  sulfamethoxazole-trimethoprim (BACTRIM DS) 800-160 MG tablet Take 1 tablet by mouth 2 (two) times daily., Starting Mon 03/09/2020, Normal    terbinafine (LAMISIL AT) 1 % cream Apply 1 application topically 2 (two) times daily., Starting Mon 03/09/2020, Normal         Note:  This document was prepared using Dragon voice recognition software and may include unintentional dictation errors.    Faythe Ghee, PA-C 03/09/20 1805    Shaune Pollack, MD 03/09/20 2209

## 2020-03-09 NOTE — ED Triage Notes (Signed)
Pt reports swelling to his left foot since last Thursday. Pt denies injuries and reports pain.

## 2020-03-09 NOTE — Discharge Instructions (Addendum)
Follow-up with podiatry if not improving in 1 to 2 weeks. Use the cream on your feet.  Take the antibiotic for the infection Return emergency department worsening Also buy over-the-counter Tinactin powder.  Put this in your shoes and socks.  Wash your socks with a bleach product to help kill spores.

## 2020-03-09 NOTE — ED Notes (Signed)
See triage note, pt reports left foot pain that start on Friday with swelling. Denies injury. Ambulatory to treatment room. No swelling noted at this time.

## 2020-12-13 IMAGING — DX DG FOOT COMPLETE 3+V*L*
3 series · 3 of 3 positions shown · non-contrast
Comparison: None.

CLINICAL DATA: Left foot pain for 5 days, swelling

EXAM:
LEFT FOOT - COMPLETE 3+ VIEW

[foot ap]
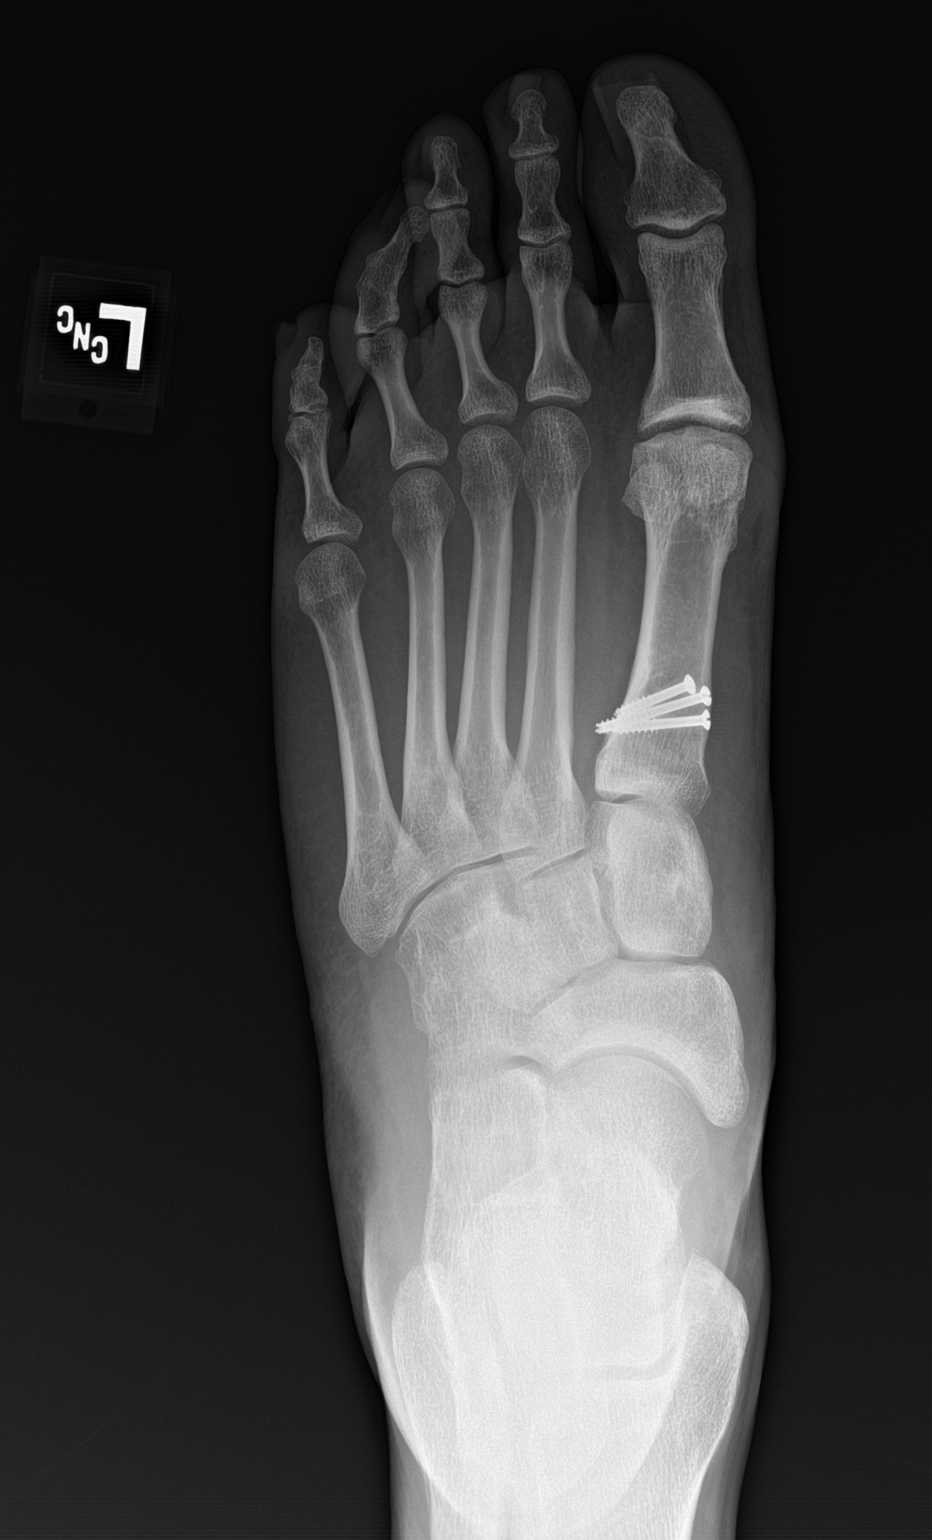

[foot obl]
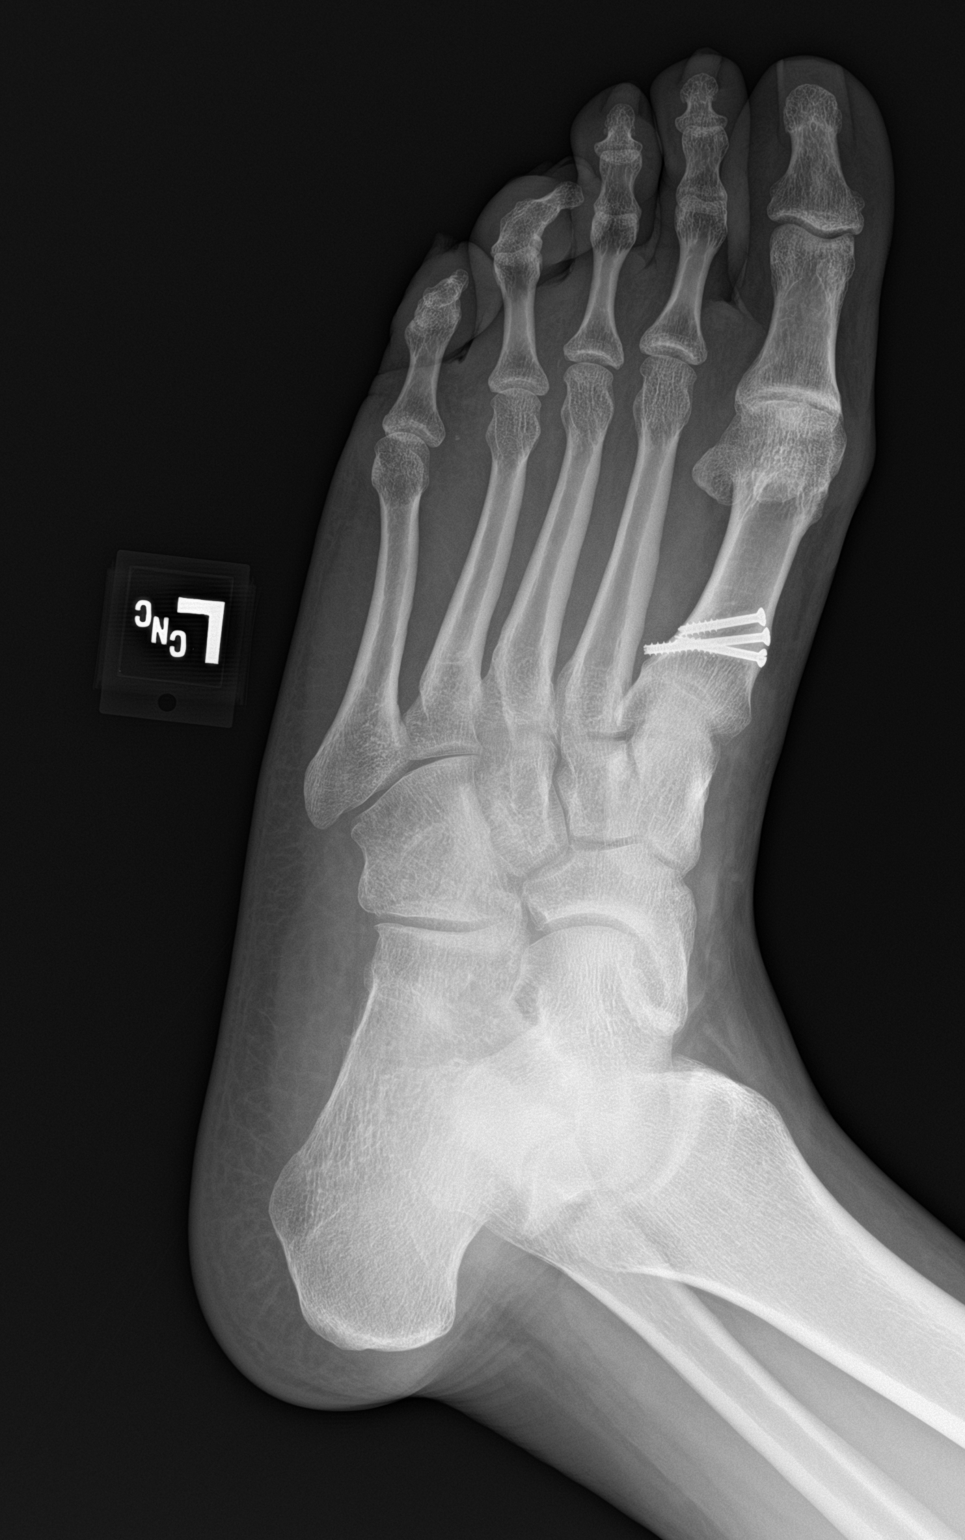

[foot lat]
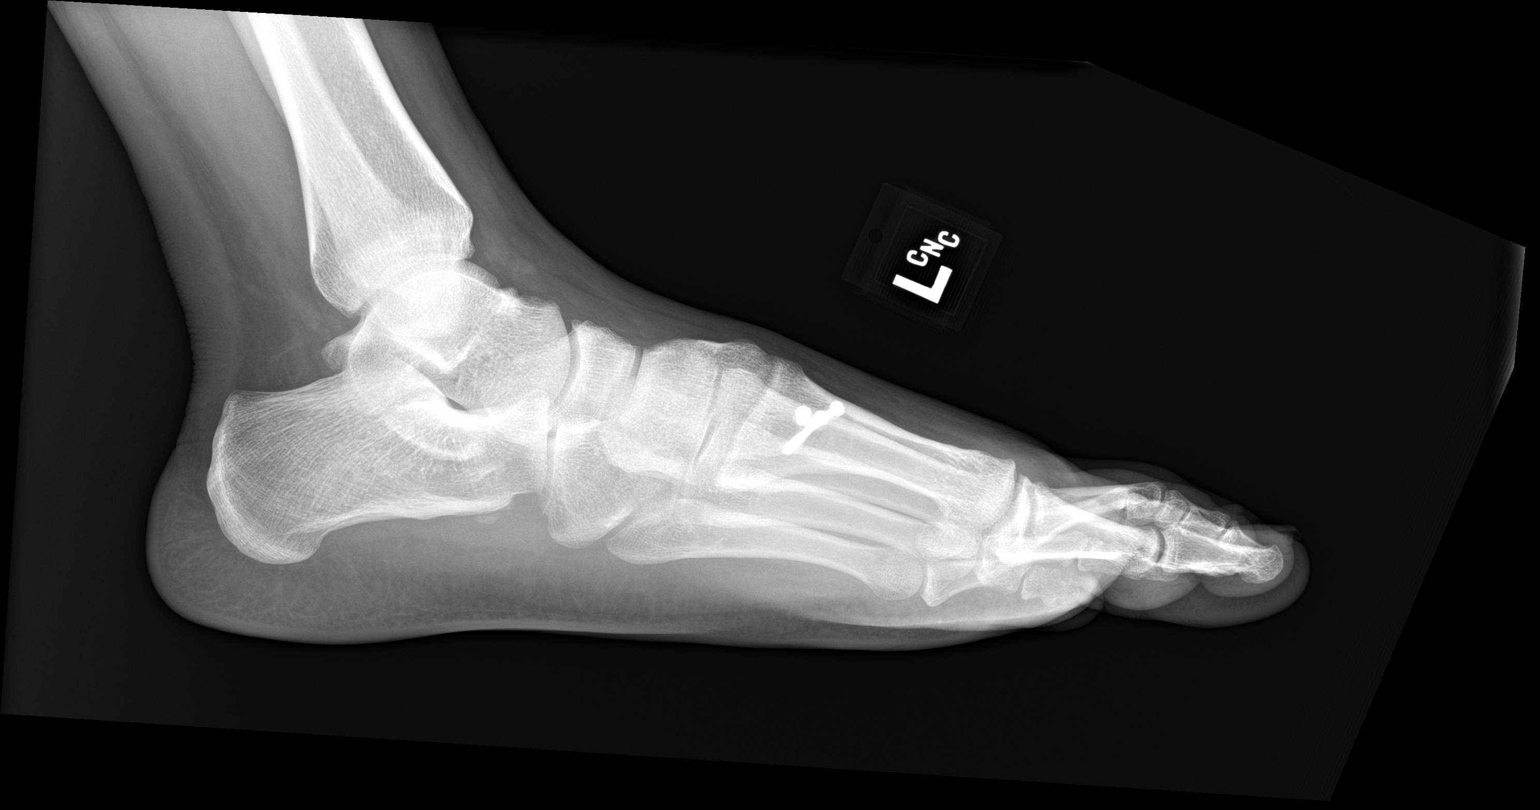

[3 of 3 positions shown; findings below may reference images not displayed]

FINDINGS: Intact hardware within the proximal aspect of the first metatarsal.
There is no evidence of fracture or dislocation. No bony erosion or
periostitis. Chronic fusion across the fourth toe DIP joint. There
is no evidence of arthropathy or other focal bone abnormality. Soft
tissues are unremarkable.
IMPRESSION: No acute osseous abnormality, left foot.

## 2022-02-25 ENCOUNTER — Other Ambulatory Visit: Payer: Self-pay

## 2022-02-25 ENCOUNTER — Emergency Department: Payer: No Typology Code available for payment source

## 2022-02-25 DIAGNOSIS — S169XXA Unspecified injury of muscle, fascia and tendon at neck level, initial encounter: Secondary | ICD-10-CM | POA: Diagnosis present

## 2022-02-25 DIAGNOSIS — M25512 Pain in left shoulder: Secondary | ICD-10-CM | POA: Insufficient documentation

## 2022-02-25 DIAGNOSIS — M545 Low back pain, unspecified: Secondary | ICD-10-CM | POA: Diagnosis not present

## 2022-02-25 DIAGNOSIS — S161XXA Strain of muscle, fascia and tendon at neck level, initial encounter: Secondary | ICD-10-CM | POA: Insufficient documentation

## 2022-02-25 DIAGNOSIS — Y92410 Unspecified street and highway as the place of occurrence of the external cause: Secondary | ICD-10-CM | POA: Insufficient documentation

## 2022-02-25 NOTE — ED Notes (Signed)
C-collar applied
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
## 2022-02-26 ENCOUNTER — Emergency Department
Admission: EM | Admit: 2022-02-26 | Discharge: 2022-02-26 | Disposition: A | Discharge status: Home / Self Care | Payer: No Typology Code available for payment source | Attending: Emergency Medicine | Admitting: Emergency Medicine

## 2022-02-26 DIAGNOSIS — S161XXA Strain of muscle, fascia and tendon at neck level, initial encounter: Secondary | ICD-10-CM

## 2022-02-26 DIAGNOSIS — M7918 Myalgia, other site: Secondary | ICD-10-CM

## 2022-02-26 NOTE — Discharge Instructions (Signed)
Please take Tylenol and ibuprofen/Advil for your pain.  It is safe to take them together, or to alternate them every few hours.  Take up to 1000mg of Tylenol at a time, up to 4 times per day.  Do not take more than 4000 mg of Tylenol in 24 hours.  For ibuprofen, take 400-600 mg, 3 - 4 times per day.  
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
## 2022-02-28 ENCOUNTER — Emergency Department
Admission: EM | Admit: 2022-02-28 | Discharge: 2022-02-28 | Disposition: A | Discharge status: Home / Self Care | Payer: No Typology Code available for payment source | Attending: Emergency Medicine | Admitting: Emergency Medicine

## 2022-02-28 ENCOUNTER — Encounter: Payer: Self-pay | Admitting: Emergency Medicine

## 2022-02-28 ENCOUNTER — Other Ambulatory Visit: Payer: Self-pay

## 2022-02-28 DIAGNOSIS — M542 Cervicalgia: Secondary | ICD-10-CM | POA: Insufficient documentation

## 2022-02-28 MED ORDER — LIDOCAINE 5 % EX PTCH
1.0000 | MEDICATED_PATCH | CUTANEOUS | Status: DC
  Administered 2022-02-28: 1 via TRANSDERMAL
  Filled 2022-02-28: qty 1

## 2022-02-28 MED ORDER — IBUPROFEN 600 MG PO TABS
600.0000 mg | ORAL_TABLET | Freq: Once | ORAL | Status: AC
  Administered 2022-02-28: 600 mg via ORAL
  Filled 2022-02-28: qty 1

## 2022-02-28 MED ORDER — LIDOCAINE 5 % EX PTCH: 1.0000 | MEDICATED_PATCH | Freq: Two times a day (BID) | CUTANEOUS | 11 refills | Status: AC
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
## 2022-02-28 NOTE — Discharge Instructions (Addendum)
Take acetaminophen 650 mg and ibuprofen 400 mg every 6 hours for pain.  Take with food. Use pain patch as prescribed.   Thank you for choosing [place] for your health care today!  Please see your primary doctor this week for a follow up appointment.   If you do not have a primary doctor call the following clinics to establish care:  If you have insurance:  [facility] [phone] [place] [address]   [facility]  [phone] [place] [place] [address]   If you do not have insurance:  [facility]  [phone] [address], [place] [address]  Sometimes, in the early stages of certain disease courses it is difficult to detect in the emergency department evaluation -- so, it is important that you continue to monitor your symptoms and call your doctor right away or return to the emergency department if you develop any new or worsening symptoms.  It was my pleasure to care for you today.   Hoover Brunette Jacelyn Grip, MD

## 2024-02-11 ENCOUNTER — Emergency Department
Admission: EM | Admit: 2024-02-11 | Discharge: 2024-02-11 | Disposition: A | Discharge status: Home / Self Care | Payer: Self-pay | Attending: Emergency Medicine | Admitting: Emergency Medicine

## 2024-02-11 ENCOUNTER — Other Ambulatory Visit: Payer: Self-pay

## 2024-02-11 ENCOUNTER — Emergency Department: Payer: Self-pay

## 2024-02-11 ENCOUNTER — Encounter: Payer: Self-pay | Admitting: Emergency Medicine

## 2024-02-11 DIAGNOSIS — R319 Hematuria, unspecified: Secondary | ICD-10-CM | POA: Insufficient documentation

## 2024-02-11 LAB — COMPREHENSIVE METABOLIC PANEL WITH GFR
ALT: 20 U/L (ref 0–44)
AST: 27 U/L (ref 15–41)
Albumin: 4.1 g/dL (ref 3.5–5.0)
Alkaline Phosphatase: 70 U/L (ref 38–126)
Anion gap: 11 (ref 5–15)
BUN: 14 mg/dL (ref 6–20)
CO2: 22 mmol/L (ref 22–32)
Calcium: 8.8 mg/dL — ABNORMAL LOW (ref 8.9–10.3)
Chloride: 109 mmol/L (ref 98–111)
Creatinine, Ser: 1.17 mg/dL (ref 0.61–1.24)
GFR, Estimated: >60 mL/min (ref >60)
Glucose, Bld: 126 mg/dL — ABNORMAL HIGH (ref 70–99)
Potassium: 3.6 mmol/L (ref 3.5–5.1)
Sodium: 142 mmol/L (ref 135–145)
Total Bilirubin: 0.6 mg/dL (ref 0.0–1.2)
Total Protein: 7.3 g/dL (ref 6.5–8.1)

## 2024-02-11 LAB — URINALYSIS, ROUTINE W REFLEX MICROSCOPIC
Bacteria, UA: NONE SEEN
RBC / HPF: >50 RBC/hpf (ref 0–5)
Squamous Epithelial / HPF: 0 /HPF (ref 0–5)
WBC, UA: >50 WBC/hpf (ref 0–5)

## 2024-02-11 LAB — CHLAMYDIA/NGC RT PCR (ARMC ONLY)
Chlamydia Tr: NOT DETECTED
N gonorrhoeae: NOT DETECTED

## 2024-02-11 LAB — CBC
HCT: 42.4 % (ref 39.0–52.0)
Hemoglobin: 14.6 g/dL (ref 13.0–17.0)
MCH: 27.9 pg (ref 26.0–34.0)
MCHC: 34.4 g/dL (ref 30.0–36.0)
MCV: 81.1 fL (ref 80.0–100.0)
Platelets: 263 K/uL (ref 150–400)
RBC: 5.23 MIL/uL (ref 4.22–5.81)
RDW: 14.4 % (ref 11.5–15.5)
WBC: 5.6 K/uL (ref 4.0–10.5)
nRBC: 0 % (ref 0.0–0.2)

## 2024-02-11 LAB — CK: Total CK: 320 U/L (ref 49–397)

## 2024-02-11 MED ORDER — CEPHALEXIN 500 MG PO CAPS: 500.0000 mg | ORAL_CAPSULE | Freq: Four times a day (QID) | ORAL | 0 refills | Status: AC

## 2024-02-11 NOTE — Discharge Instructions (Signed)
 Please follow-up with the urologist, call tomorrow to make an appointment.  Take the antibiotics in the meantime.  Please return for any new, worsening, or change in symptoms or other concerns.  It was a pleasure caring for you today.

## 2024-02-11 NOTE — ED Provider Notes (Signed)
 [facility] Provider Note    Event Date/Time   First MD Initiated Contact with Patient 02/11/24 1201     (approximate)   History   Hematuria   HPI  Alejandro Mckee. is a 33 y.o. male who presents today for evaluation of hematuria.  Patient reports that he noted that his urine was dark yesterday and today prompting him to come in for evaluation.  He has not had any flank pain or abdominal pain.  He reports that he does not smoke.  He reports that he is sexually active with 1 male partner and sometimes uses protection.  No fevers or chills.  No history of kidney stones.  There are no active problems to display for this patient.         Physical Exam   Triage Vital Signs: ED Triage Vitals  Encounter Vitals Group     BP --      Girls Systolic BP Percentile --      Girls Diastolic BP Percentile --      Boys Systolic BP Percentile --      Boys Diastolic BP Percentile --      Pulse Rate 02/11/24 1143 74     Resp 02/11/24 1143 18     Temp --      Temp src --      SpO2 02/11/24 1143 97 %     Weight 02/11/24 1144 250 lb (113.4 kg)     Height 02/11/24 1144 6' 1 (1.854 m)     Head Circumference --      Peak Flow --      Pain Score 02/11/24 1143 0     Pain Loc --      Pain Education --      Exclude from Growth Chart --     Most recent vital signs: Vitals:   02/11/24 1143 02/11/24 1204  BP:  (!) 152/108  Pulse: 74   Resp: 18   Temp:  (!) 97.4 F (36.3 C)  SpO2: 97%     Physical Exam Vitals and nursing note reviewed.  Constitutional:      General: Awake and alert. No acute distress.    Appearance: Normal appearance. The patient is normal weight.  HENT:     Head: Normocephalic and atraumatic.     Mouth: Mucous membranes are moist.  Eyes:     General: PERRL. Normal EOMs        Right eye: No discharge.        Left eye: No discharge.     Conjunctiva/sclera: Conjunctivae normal.  Cardiovascular:     Rate and Rhythm: Normal rate and  regular rhythm.     Pulses: Normal pulses.  Pulmonary:     Effort: Pulmonary effort is normal. No respiratory distress.     Breath sounds: Normal breath sounds.  Abdominal:     Abdomen is soft. There is no abdominal tenderness. No rebound or guarding. No distention.  No CVA tenderness Musculoskeletal:        General: No swelling. Normal range of motion.     Cervical back: Normal range of motion and neck supple.  Skin:    General: Skin is warm and dry.     Capillary Refill: Capillary refill takes less than 2 seconds.     Findings: No rash.  Neurological:     Mental Status: The patient is awake and alert.      ED Results / Procedures / Treatments  Labs (all labs ordered are listed, but only abnormal results are displayed) Labs Reviewed  COMPREHENSIVE METABOLIC PANEL WITH GFR - Abnormal; Notable for the following components:      Result Value   Glucose, Bld 126 (*)    Calcium 8.8 (*)    All other components within normal limits  URINALYSIS, ROUTINE W REFLEX MICROSCOPIC - Abnormal; Notable for the following components:   Color, Urine RED (*)    APPearance CLOUDY (*)    Glucose, UA   (*)    Value: TEST NOT REPORTED DUE TO COLOR INTERFERENCE OF URINE PIGMENT   Hgb urine dipstick   (*)    Value: TEST NOT REPORTED DUE TO COLOR INTERFERENCE OF URINE PIGMENT   Bilirubin Urine   (*)    Value: TEST NOT REPORTED DUE TO COLOR INTERFERENCE OF URINE PIGMENT   Ketones, ur   (*)    Value: TEST NOT REPORTED DUE TO COLOR INTERFERENCE OF URINE PIGMENT   Protein, ur   (*)    Value: TEST NOT REPORTED DUE TO COLOR INTERFERENCE OF URINE PIGMENT   Nitrite   (*)    Value: TEST NOT REPORTED DUE TO COLOR INTERFERENCE OF URINE PIGMENT   Leukocytes,Ua   (*)    Value: TEST NOT REPORTED DUE TO COLOR INTERFERENCE OF URINE PIGMENT   All other components within normal limits  CHLAMYDIA/NGC RT PCR ([facility] ONLY)            URINE CULTURE  CBC  CK     EKG     RADIOLOGY I independently reviewed and  interpreted imaging and agree with radiologists findings.     PROCEDURES:  Critical Care performed:   Procedures   MEDICATIONS ORDERED IN ED: Medications - No data to display   IMPRESSION / MDM / ASSESSMENT AND PLAN / ED COURSE  I reviewed the triage vital signs and the nursing notes.   Differential diagnosis includes, but is not limited to, hematuria, bladder mass, kidney stone, glomerulonephritis.  Patient is awake and alert, hemodynamically stable and afebrile.  He is nontoxic in appearance.  He is resting comfortably on the stretcher.  He has no abdominal tenderness or flank pain.  Further workup is indicated.  Labs obtained are overall reassuring, no leukocytosis, stable H&H, normal creatinine.  Urinalysis does reveal hematuria, greater than 50 RBCs and greater than 50 WBCs.  No bacteria.  Gonorrhea/chlamydia were negative.  Will treat for infection given his concurrent WBCs.  CT renal study obtained is negative for any acute findings.  I recommended close outpatient follow-up with urology for further workup including possible cystoscopy.  He was given the appropriate follow-up information and instructed to call tomorrow.  He understands return precautions in the meantime.  Patient understands and agrees with plan.  He was discharged in stable condition.   Patient's presentation is most consistent with acute complicated illness / injury requiring diagnostic workup.     FINAL CLINICAL IMPRESSION(S) / ED DIAGNOSES   Final diagnoses:  Hematuria, unspecified type     Rx / DC Orders   ED Discharge Orders          Ordered    cephALEXin  (KEFLEX ) 500 MG capsule  4 times daily        02/11/24 1519             Note:  This document was prepared using Dragon voice recognition software and may include unintentional dictation errors.   Alejandro Lober Mckee, Alejandro Mckee 02/11/24 1741  Floy Roberts, MD 02/11/24 [phone]

## 2024-02-11 NOTE — ED Triage Notes (Signed)
 Pt to ED via POV for hematuria. Pt stating he started peeing dark red blood last night, denies any pain with urination. Pt denies any recent trauma to stomach.

## 2024-02-11 NOTE — ED Notes (Signed)
 See triage note  Presents with some possible blood in urine this morning

## 2024-02-12 LAB — URINE CULTURE: Culture: NO GROWTH

## 2024-02-14 ENCOUNTER — Ambulatory Visit: Payer: Self-pay | Admitting: Urology

## 2024-02-15 ENCOUNTER — Encounter: Payer: Self-pay | Admitting: Intensive Care

## 2024-02-15 ENCOUNTER — Other Ambulatory Visit: Payer: Self-pay

## 2024-02-15 ENCOUNTER — Emergency Department
Admission: EM | Admit: 2024-02-15 | Discharge: 2024-02-15 | Discharge status: Against Medical Advice | Payer: Self-pay | Attending: Emergency Medicine | Admitting: Emergency Medicine

## 2024-02-15 ENCOUNTER — Telehealth: Payer: Self-pay

## 2024-02-15 DIAGNOSIS — R319 Hematuria, unspecified: Secondary | ICD-10-CM | POA: Insufficient documentation

## 2024-02-15 DIAGNOSIS — Z5321 Procedure and treatment not carried out due to patient leaving prior to being seen by health care provider: Secondary | ICD-10-CM | POA: Insufficient documentation

## 2024-02-15 NOTE — Telephone Encounter (Signed)
 Pt walked into the office today for an appointment.   Front desk advised pt that his appointment was scheduled for yesterday with [facility] at 2:15. She apologized for the misunderstanding and offered to get pt rescheduled. He stated no he has to be seen today. He will wait. She again informed him there are no available appointments today he proceeded to say he had to be seen and he sat down in the lobby.   Front desk informed me of the issue.   Pt was scheduled for 9/17 at 215 with [facility] in [place] on 02/13/24 at 1120am. Pts my chart is active however pt has never logged in.  No email on file. Only 1 number.   Called patient from waiting room.   Apologized for the misunderstanding.  Confirmed that our records show his appt was for yesterday.  Pt states he has been pissing blood since Saturday and he needed help. I stated we are happy to help him. I can get him rescheduled as there are no available appointments today. He states whoever he spoke with told him his appointment was for today. He states he has an email that states it was for today. He stated that he has another phone and  that this email is on that phone.  He offered to get the phone from his car.  I advised pt that his communication preferences  in Epic are only for a text message. Informed him that we do not have a email on file. Asked pt if the number we had on file was correct. He states you have my phone number. He did not confirm if the number we have is correct.   He asked my name and walked out stating he will find someone else to help him.

## 2024-02-15 NOTE — ED Triage Notes (Signed)
 Patient reports he has been having blood in his urine since Saturday. Went to urologist today and they would not see him after telling him he missed his appointment yesterday.   Patient would like to wait and see doctor before any protocols completed

## 2024-02-15 NOTE — ED Notes (Signed)
 Registration was in room when pt became frustrated that he has had no answers. Pt left room and exited treatment area while registration was still in room.

## 2024-02-15 NOTE — ED Provider Notes (Signed)
 Patient left prior to my evaluation.    Floy Roberts, MD 02/15/24 1126

## 2024-02-21 ENCOUNTER — Other Ambulatory Visit: Payer: Self-pay

## 2024-02-21 DIAGNOSIS — R319 Hematuria, unspecified: Secondary | ICD-10-CM

## 2024-02-22 ENCOUNTER — Ambulatory Visit: Payer: Self-pay | Admitting: Urology

## 2024-02-22 ENCOUNTER — Encounter: Payer: Self-pay | Admitting: Urology
